# Patient Record
Sex: Female | Born: 1935 | Race: Black or African American | Hispanic: No
Health system: Southern US, Community
[De-identification: ages and names within clinical notes are randomized; demographics above are authoritative.]

---

## 2002-10-07 ENCOUNTER — Encounter: Payer: Self-pay | Admitting: Internal Medicine

## 2002-10-07 ENCOUNTER — Ambulatory Visit (HOSPITAL_COMMUNITY): Admission: RE | Admit: 2002-10-07 | Discharge: 2002-10-07 | Payer: Self-pay | Admitting: Internal Medicine

## 2003-10-15 ENCOUNTER — Ambulatory Visit (HOSPITAL_COMMUNITY): Admission: RE | Admit: 2003-10-15 | Discharge: 2003-10-15 | Payer: Self-pay | Admitting: Family Medicine

## 2004-04-06 ENCOUNTER — Ambulatory Visit (HOSPITAL_COMMUNITY): Admission: RE | Admit: 2004-04-06 | Discharge: 2004-04-06 | Payer: Self-pay | Admitting: Family Medicine

## 2004-06-22 ENCOUNTER — Ambulatory Visit (HOSPITAL_COMMUNITY): Admission: RE | Admit: 2004-06-22 | Discharge: 2004-06-22 | Payer: Self-pay | Admitting: Pediatrics

## 2004-07-26 ENCOUNTER — Emergency Department (HOSPITAL_COMMUNITY): Admission: EM | Admit: 2004-07-26 | Discharge: 2004-07-26 | Payer: Self-pay

## 2004-07-29 ENCOUNTER — Ambulatory Visit (HOSPITAL_COMMUNITY): Admission: RE | Admit: 2004-07-29 | Discharge: 2004-07-29 | Payer: Self-pay | Admitting: Family Medicine

## 2004-08-03 ENCOUNTER — Ambulatory Visit (HOSPITAL_COMMUNITY): Admission: RE | Admit: 2004-08-03 | Discharge: 2004-08-03 | Payer: Self-pay | Admitting: Family Medicine

## 2004-08-17 ENCOUNTER — Ambulatory Visit (HOSPITAL_COMMUNITY): Admission: RE | Admit: 2004-08-17 | Discharge: 2004-08-17 | Payer: Self-pay | Admitting: Family Medicine

## 2004-11-02 ENCOUNTER — Ambulatory Visit: Payer: Self-pay | Admitting: Family Medicine

## 2005-01-04 ENCOUNTER — Ambulatory Visit: Payer: Self-pay | Admitting: Family Medicine

## 2005-01-04 ENCOUNTER — Ambulatory Visit (HOSPITAL_COMMUNITY): Admission: RE | Admit: 2005-01-04 | Discharge: 2005-01-04 | Payer: Self-pay | Admitting: Family Medicine

## 2005-02-15 ENCOUNTER — Ambulatory Visit: Payer: Self-pay | Admitting: Family Medicine

## 2005-03-30 ENCOUNTER — Ambulatory Visit: Payer: Self-pay | Admitting: Family Medicine

## 2005-04-15 ENCOUNTER — Ambulatory Visit: Payer: Self-pay | Admitting: Family Medicine

## 2005-05-24 ENCOUNTER — Ambulatory Visit: Payer: Self-pay | Admitting: Family Medicine

## 2005-07-18 ENCOUNTER — Ambulatory Visit (HOSPITAL_COMMUNITY): Admission: RE | Admit: 2005-07-18 | Discharge: 2005-07-18 | Payer: Self-pay | Admitting: Family Medicine

## 2005-07-27 ENCOUNTER — Emergency Department (HOSPITAL_COMMUNITY): Admission: EM | Admit: 2005-07-27 | Discharge: 2005-07-27 | Payer: Self-pay | Admitting: Emergency Medicine

## 2005-08-09 ENCOUNTER — Ambulatory Visit: Payer: Self-pay | Admitting: Family Medicine

## 2005-10-20 ENCOUNTER — Emergency Department (HOSPITAL_COMMUNITY): Admission: EM | Admit: 2005-10-20 | Discharge: 2005-10-20 | Payer: Self-pay | Admitting: Emergency Medicine

## 2005-11-04 ENCOUNTER — Ambulatory Visit: Payer: Self-pay | Admitting: Family Medicine

## 2006-03-02 ENCOUNTER — Ambulatory Visit: Payer: Self-pay | Admitting: Family Medicine

## 2006-07-18 ENCOUNTER — Ambulatory Visit: Payer: Self-pay | Admitting: Family Medicine

## 2006-10-13 ENCOUNTER — Emergency Department (HOSPITAL_COMMUNITY): Admission: EM | Admit: 2006-10-13 | Discharge: 2006-10-13 | Payer: Self-pay | Admitting: Emergency Medicine

## 2006-10-30 ENCOUNTER — Ambulatory Visit (HOSPITAL_COMMUNITY): Admission: RE | Admit: 2006-10-30 | Discharge: 2006-10-30 | Payer: Self-pay | Admitting: Family Medicine

## 2006-10-30 ENCOUNTER — Ambulatory Visit: Payer: Self-pay | Admitting: Family Medicine

## 2006-11-14 ENCOUNTER — Ambulatory Visit: Payer: Self-pay | Admitting: Family Medicine

## 2006-12-01 ENCOUNTER — Ambulatory Visit: Payer: Self-pay | Admitting: Family Medicine

## 2006-12-14 ENCOUNTER — Ambulatory Visit: Payer: Self-pay | Admitting: Gastroenterology

## 2006-12-26 ENCOUNTER — Ambulatory Visit: Payer: Self-pay | Admitting: Family Medicine

## 2007-01-11 ENCOUNTER — Ambulatory Visit (HOSPITAL_COMMUNITY): Admission: RE | Admit: 2007-01-11 | Discharge: 2007-01-11 | Payer: Self-pay | Admitting: Gastroenterology

## 2007-01-11 ENCOUNTER — Ambulatory Visit: Payer: Self-pay | Admitting: Gastroenterology

## 2007-01-11 ENCOUNTER — Encounter (INDEPENDENT_AMBULATORY_CARE_PROVIDER_SITE_OTHER): Payer: Self-pay | Admitting: Specialist

## 2007-02-02 ENCOUNTER — Ambulatory Visit: Payer: Self-pay | Admitting: Family Medicine

## 2007-03-15 ENCOUNTER — Ambulatory Visit: Payer: Self-pay | Admitting: Family Medicine

## 2007-03-16 ENCOUNTER — Encounter: Payer: Self-pay | Admitting: Family Medicine

## 2007-03-16 LAB — CONVERTED CEMR LAB
ALT: 15 units/L (ref 0–35)
AST: 19 units/L (ref 0–37)
Alkaline Phosphatase: 64 units/L (ref 39–117)
BUN: 40 mg/dL — ABNORMAL HIGH (ref 6–23)
Basophils Relative: 0 % (ref 0–1)
Calcium: 9.1 mg/dL (ref 8.4–10.5)
Cholesterol: 143 mg/dL (ref 0–200)
Creatinine, Ser: 1.82 mg/dL — ABNORMAL HIGH (ref 0.40–1.20)
Eosinophils Absolute: 0.1 10*3/uL (ref 0.0–0.7)
Indirect Bilirubin: 0.2 mg/dL (ref 0.0–0.9)
MCHC: 30.8 g/dL (ref 30.0–36.0)
MCV: 92.6 fL (ref 78.0–100.0)
Monocytes Relative: 5 % (ref 3–11)
Neutrophils Relative %: 75 % (ref 43–77)
Potassium: 4.4 meq/L (ref 3.5–5.3)
RBC: 3.37 M/uL — ABNORMAL LOW (ref 3.87–5.11)
Total Protein: 6.8 g/dL (ref 6.0–8.3)
Triglycerides: 80 mg/dL (ref <150)
VLDL: 16 mg/dL (ref 0–40)

## 2007-03-17 ENCOUNTER — Ambulatory Visit: Payer: Self-pay | Admitting: Gastroenterology

## 2007-03-17 ENCOUNTER — Inpatient Hospital Stay (HOSPITAL_COMMUNITY): Admission: EM | Admit: 2007-03-17 | Discharge: 2007-03-19 | Payer: Self-pay | Admitting: Emergency Medicine

## 2007-03-30 ENCOUNTER — Ambulatory Visit (HOSPITAL_COMMUNITY): Admission: RE | Admit: 2007-03-30 | Discharge: 2007-03-30 | Payer: Self-pay | Admitting: Family Medicine

## 2007-04-04 ENCOUNTER — Ambulatory Visit (HOSPITAL_COMMUNITY): Admission: RE | Admit: 2007-04-04 | Discharge: 2007-04-04 | Payer: Self-pay | Admitting: Family Medicine

## 2007-05-09 ENCOUNTER — Ambulatory Visit: Payer: Self-pay | Admitting: Family Medicine

## 2007-06-16 ENCOUNTER — Emergency Department (HOSPITAL_COMMUNITY): Admission: EM | Admit: 2007-06-16 | Discharge: 2007-06-16 | Payer: Self-pay | Admitting: Emergency Medicine

## 2007-06-18 ENCOUNTER — Ambulatory Visit: Payer: Self-pay | Admitting: Orthopedic Surgery

## 2007-06-19 ENCOUNTER — Ambulatory Visit: Payer: Self-pay | Admitting: Family Medicine

## 2007-07-02 ENCOUNTER — Ambulatory Visit: Payer: Self-pay | Admitting: Family Medicine

## 2007-07-18 ENCOUNTER — Ambulatory Visit: Payer: Self-pay | Admitting: Orthopedic Surgery

## 2007-08-16 ENCOUNTER — Ambulatory Visit: Payer: Self-pay | Admitting: Orthopedic Surgery

## 2007-08-22 ENCOUNTER — Ambulatory Visit: Payer: Self-pay | Admitting: Family Medicine

## 2007-08-27 ENCOUNTER — Emergency Department (HOSPITAL_COMMUNITY): Admission: EM | Admit: 2007-08-27 | Discharge: 2007-08-28 | Payer: Self-pay | Admitting: *Deleted

## 2007-09-22 ENCOUNTER — Inpatient Hospital Stay (HOSPITAL_COMMUNITY): Admission: EM | Admit: 2007-09-22 | Discharge: 2007-09-26 | Payer: Self-pay | Admitting: Emergency Medicine

## 2007-09-25 ENCOUNTER — Ambulatory Visit: Payer: Self-pay | Admitting: Family Medicine

## 2007-10-27 IMAGING — CR DG HAND COMPLETE 3+V*R*
2 series · 2 of 2 positions shown · non-contrast
Comparison: none

CLINICAL DATA: Fall. Hand trauma and pain.
 RIGHT HAND - 3 VIEW:

[view not recorded (1 of 2)]
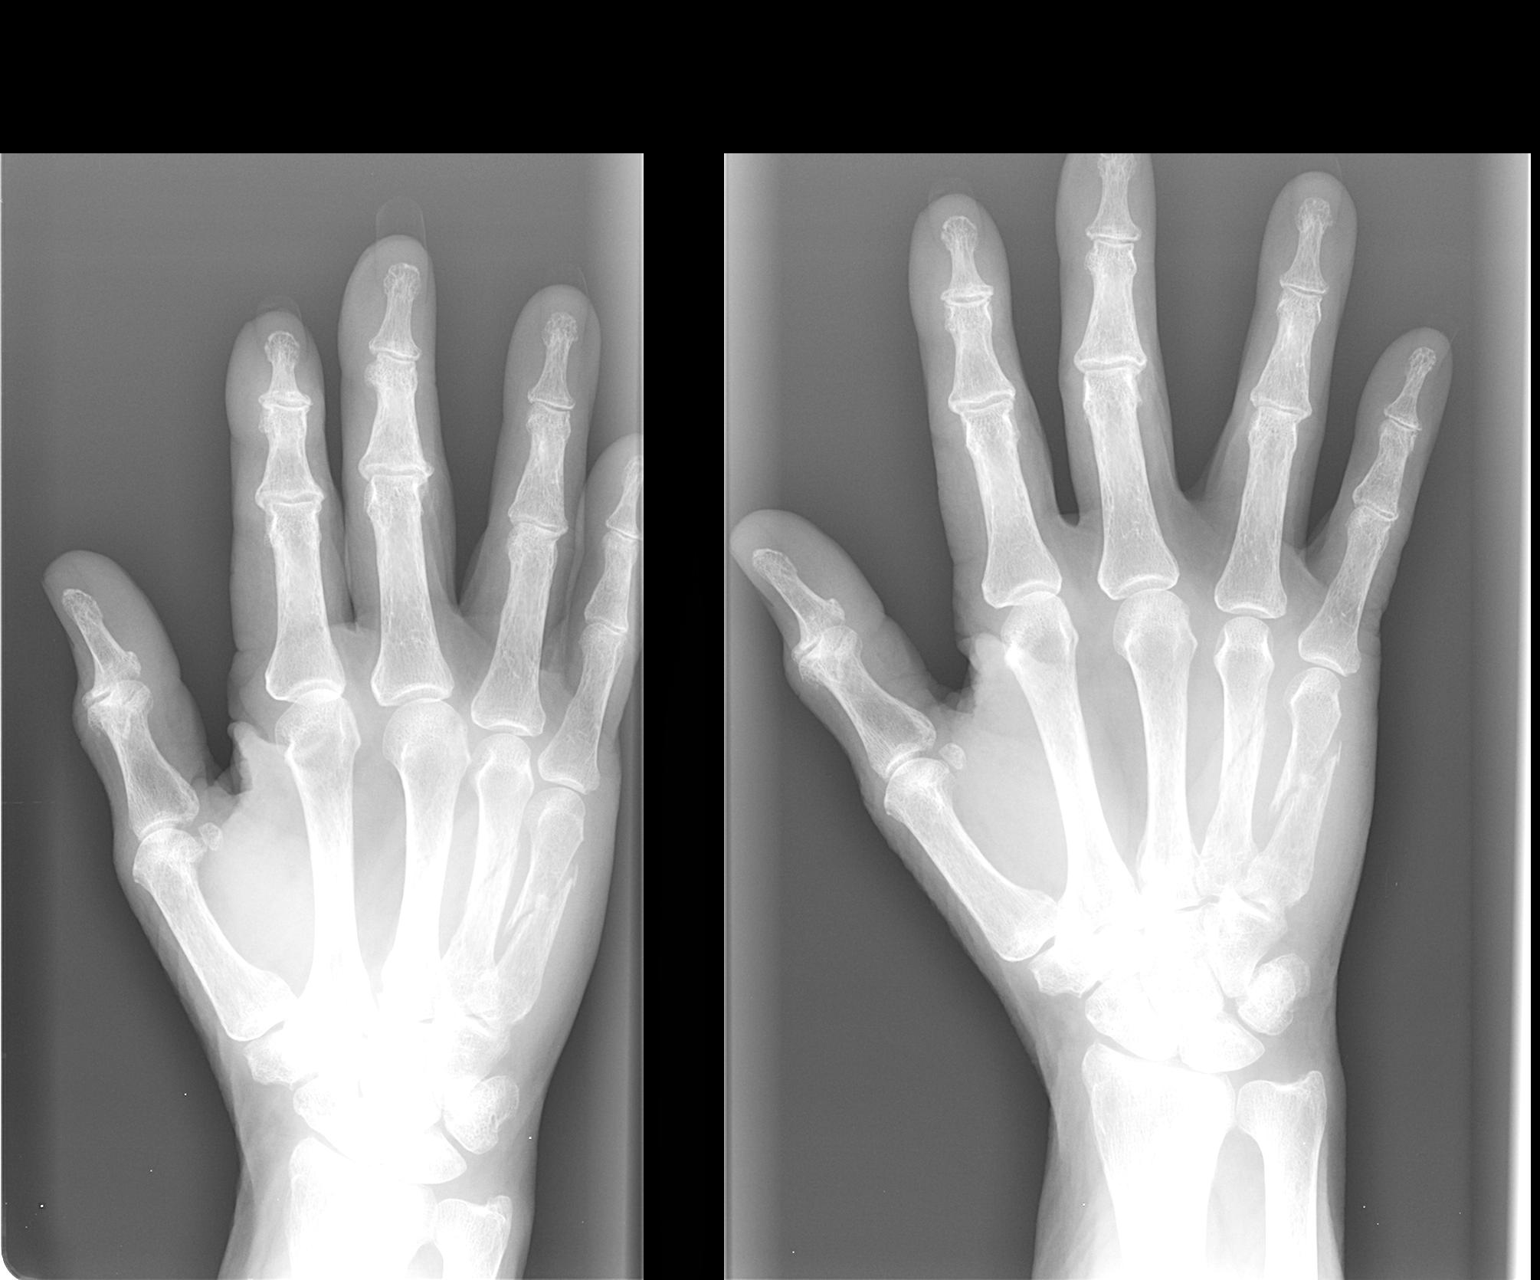

[view not recorded (2 of 2)]
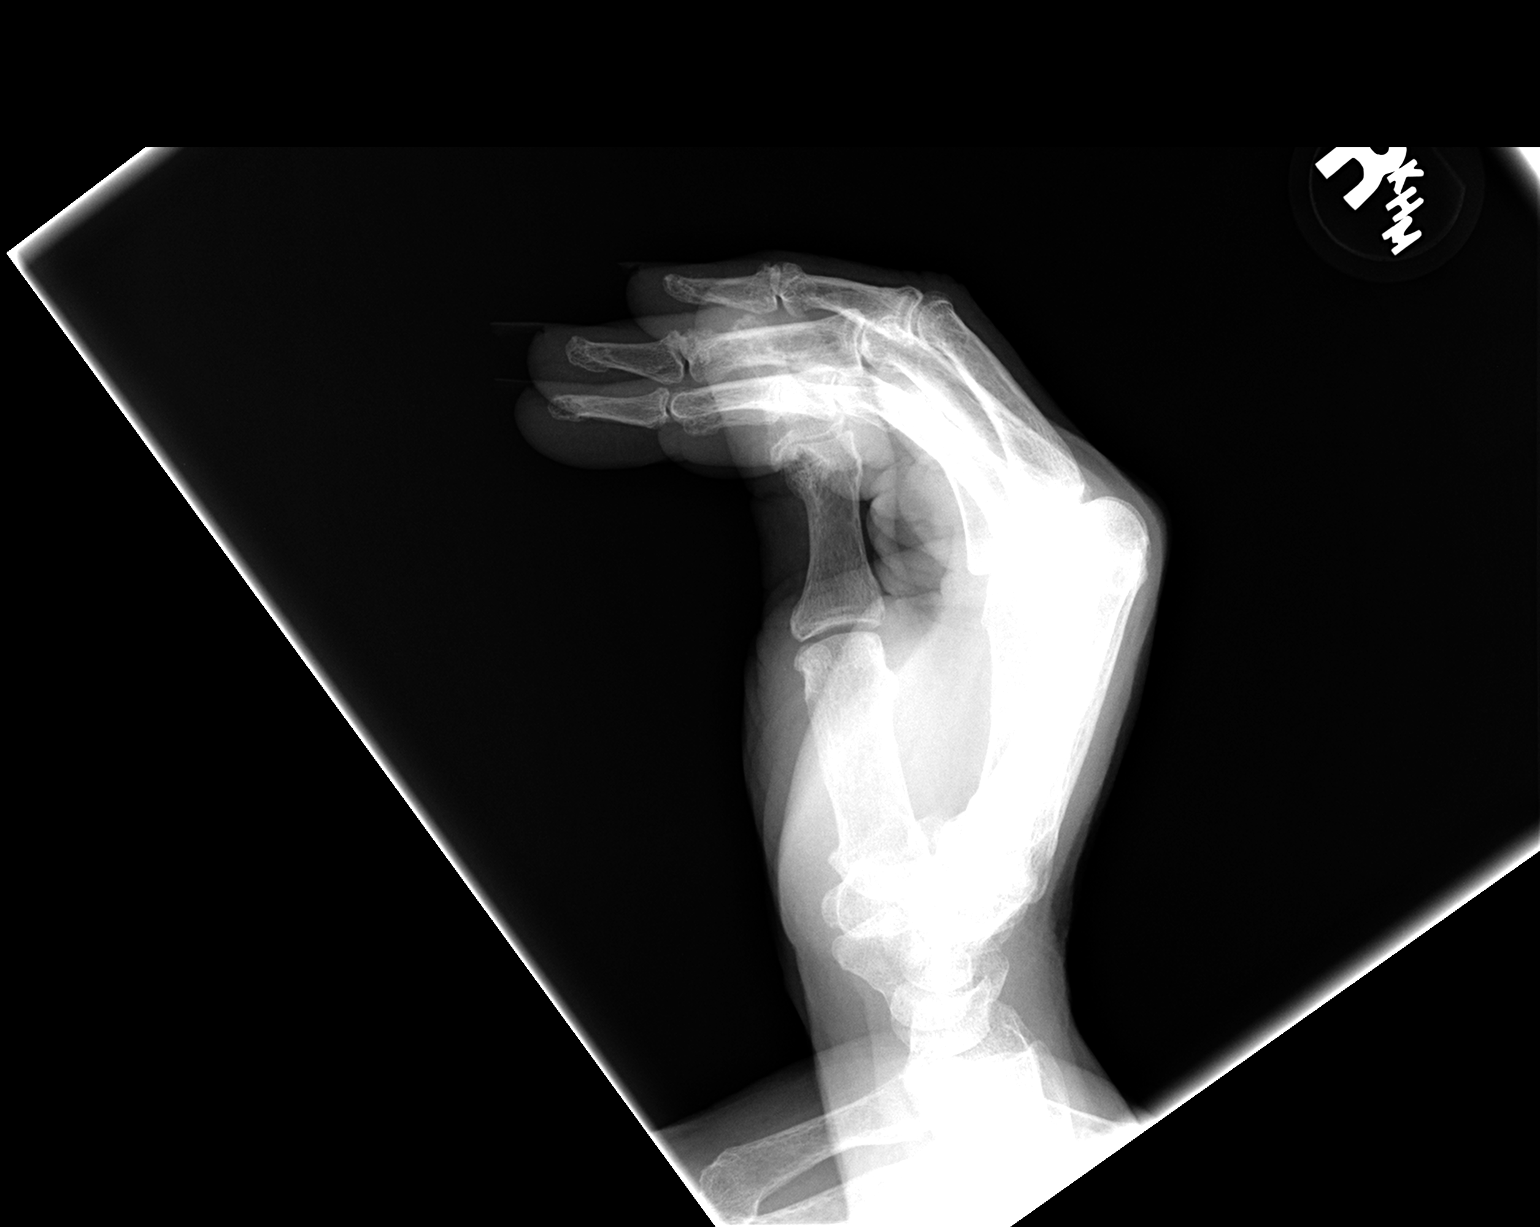

[2 of 2 positions shown; findings below may reference images not displayed]

FINDINGS: Oblique fractures are seen involving the fourth and fifth metacarpal shafts. There is mild impaction of the fifth metacarpal fracture.
 No other definite fractures are seen. Alignment of the bones is normal. Mild osteoarthritis is seen involving the interphalangeal joints of all digits.
IMPRESSION: Oblique fractures of the fourth and fifth metacarpal shafts.

## 2007-12-20 ENCOUNTER — Encounter: Payer: Self-pay | Admitting: Family Medicine

## 2008-02-02 IMAGING — CT CT HEAD W/O CM
1 series · 15 of 30 positions shown, 19 images · IV contrast (agent unspecified)
Comparison: 10/20/2005

CLINICAL DATA: Altered mental status change. 
HEAD CT WITHOUT CONTRAST:
TECHNIQUE: Contiguous axial images were obtained from the base of the skull through the vertex according to standard protocol without contrast.

[Series 2: headseq 4.8 h37s · axial · 0.43mm/px · z∈[+62,+190]mm · 15 of 30 slices shown, 19 images]
[im 2/30  brain]
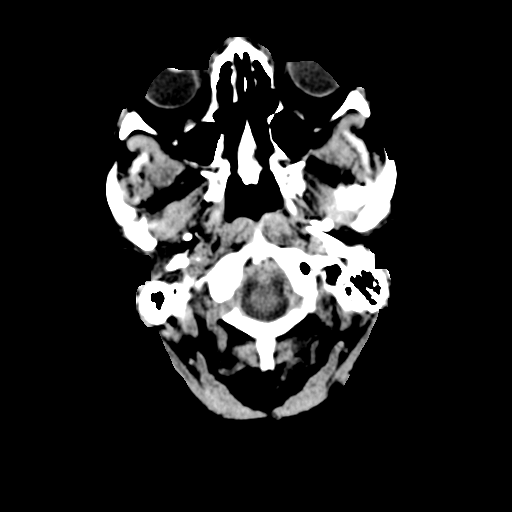
[im 2/30  bone]
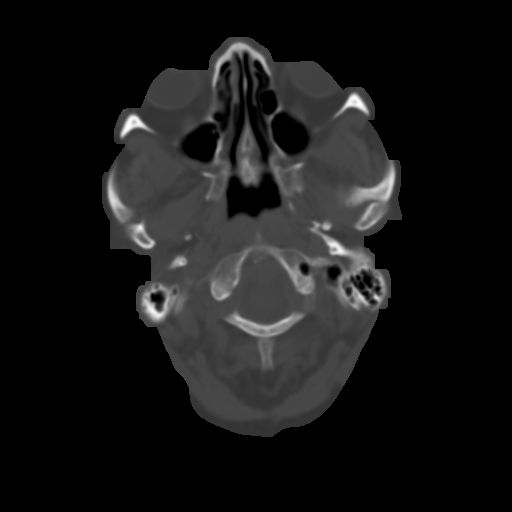
[im 4/30  brain]
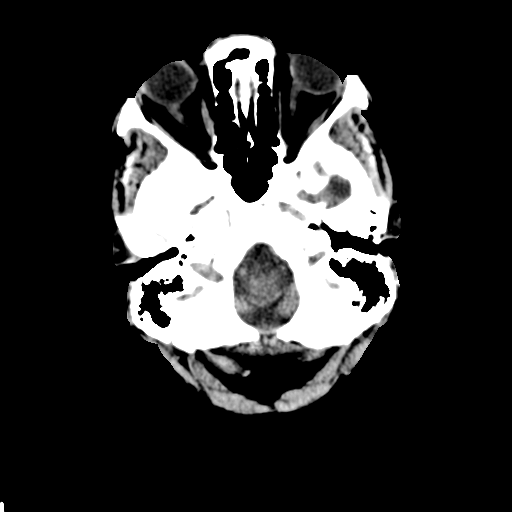
[im 6/30  brain]
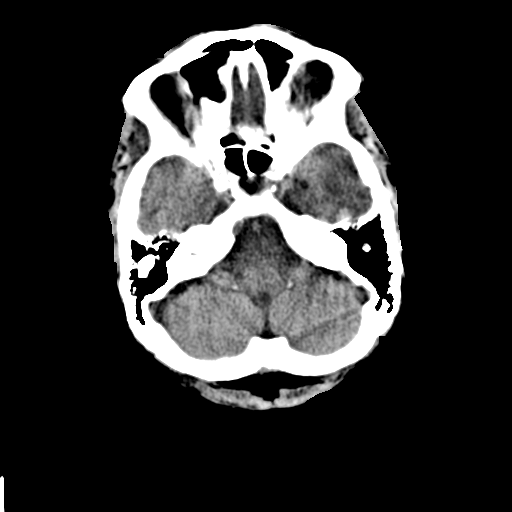
[im 8/30  brain]
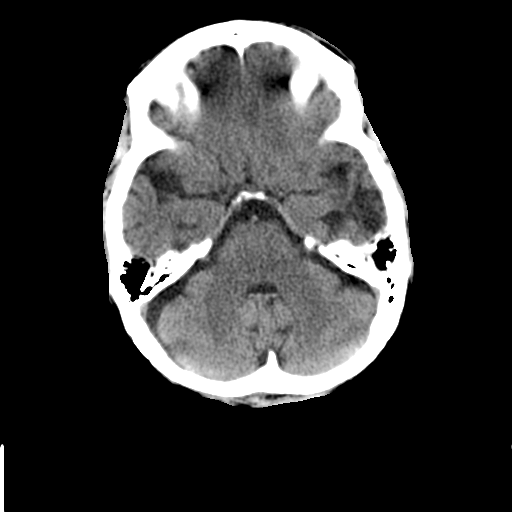
[im 10/30  brain]
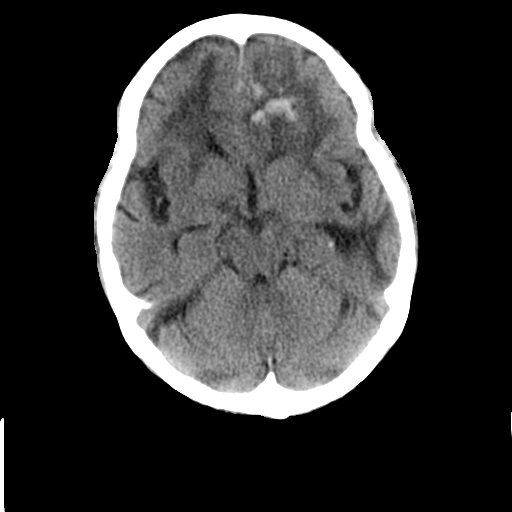
[im 10/30  bone]
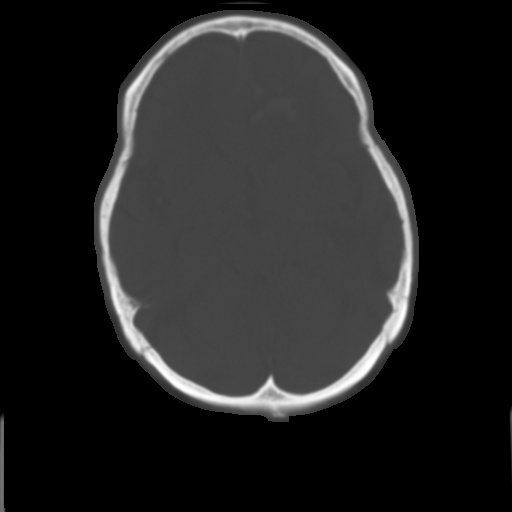
[im 12/30  brain]
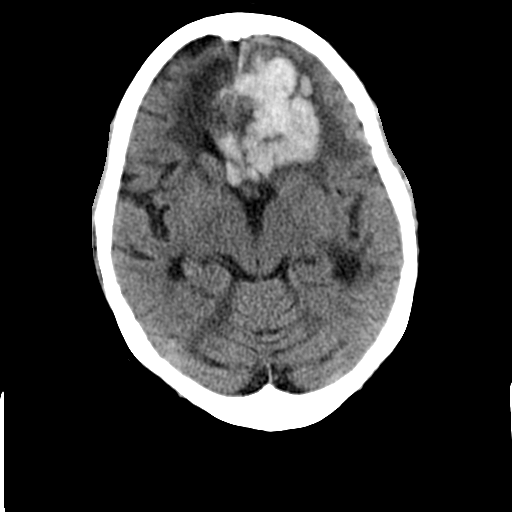
[im 14/30  brain]
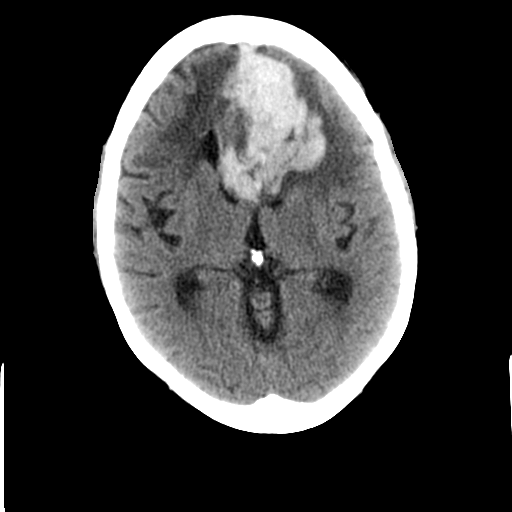
[im 16/30  brain]
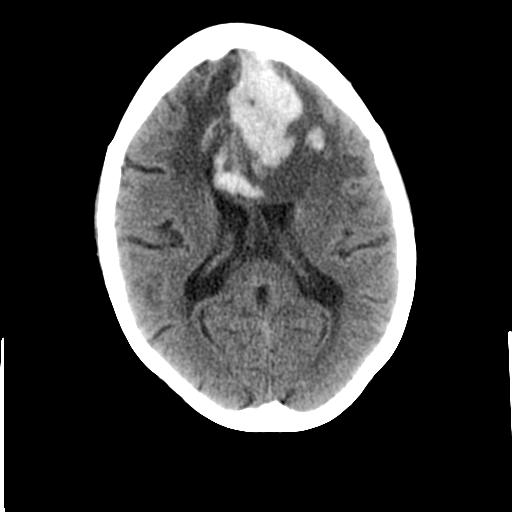
[im 17/30  brain]
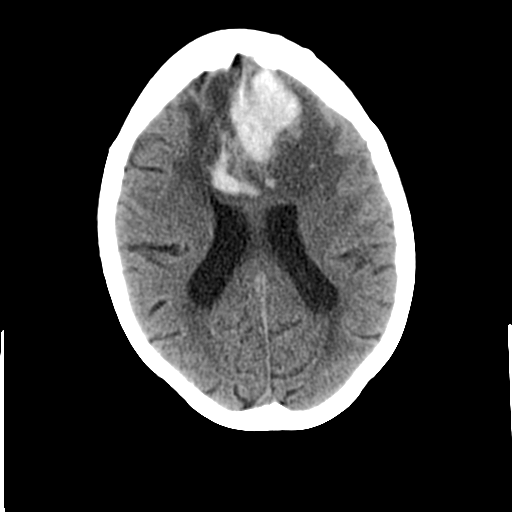
[im 17/30  bone]
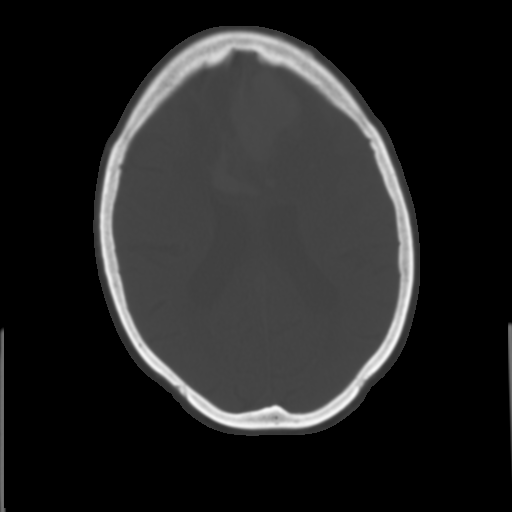
[im 19/30  brain]
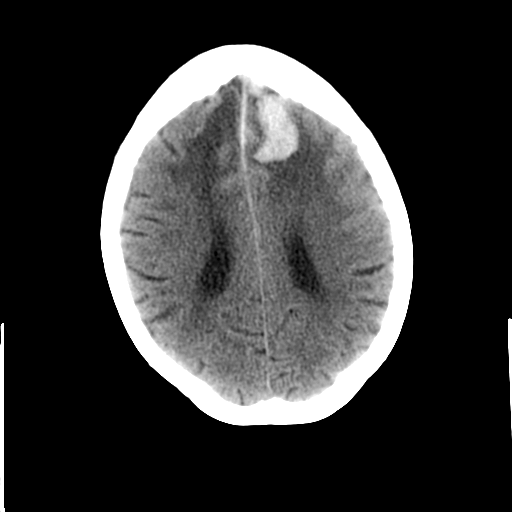
[im 21/30  brain]
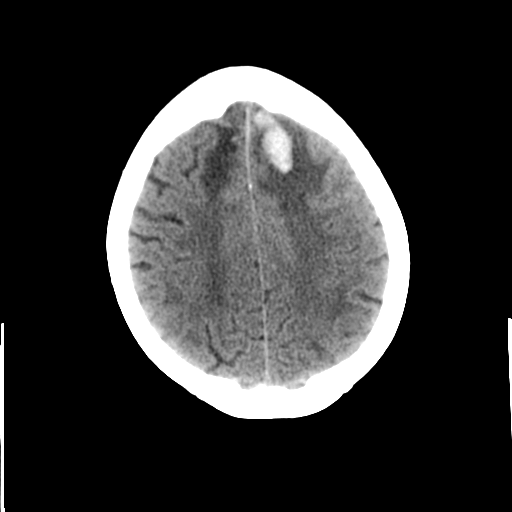
[im 23/30  brain]
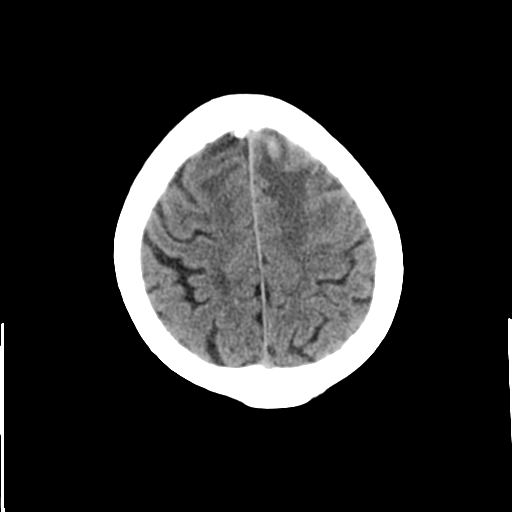
[im 25/30  brain]
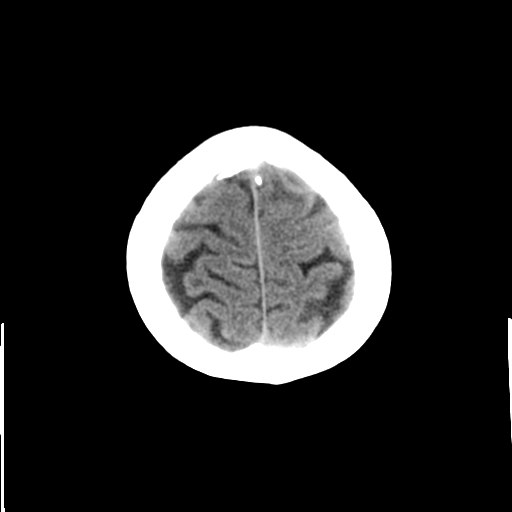
[im 25/30  bone]
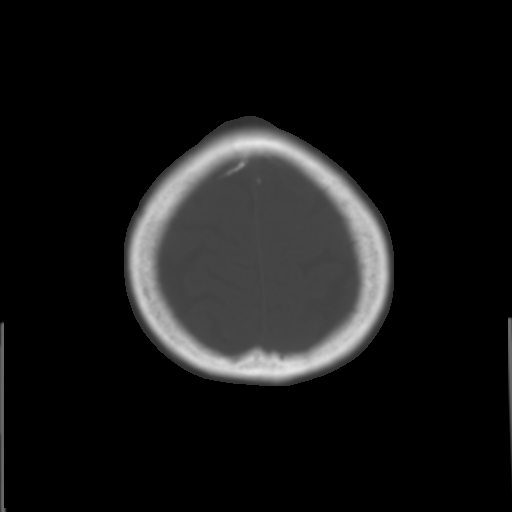
[im 27/30  brain]
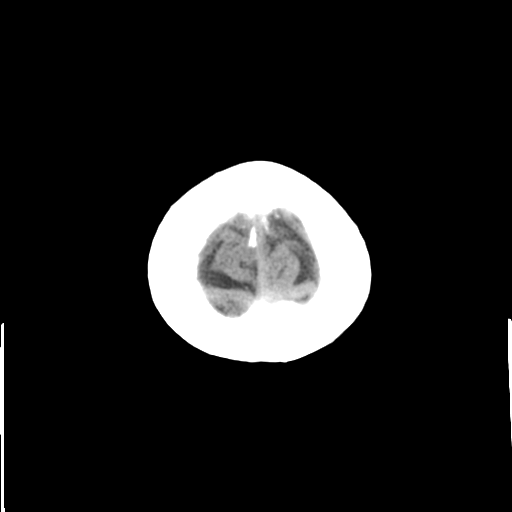
[im 29/30  brain]
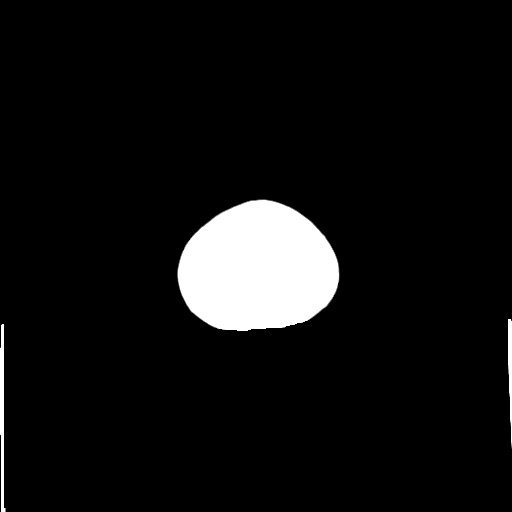

[15 of 30 positions shown; findings below may reference images not displayed]

FINDINGS: There is a large intraparenchymal hemorrhage centered within the left anterior frontal lobe measuring 5.5 cm x 3.5 cm in greatest axial dimension. This intraparenchymal hemorrhage has mass effect compressing the left frontal horn of the ventricle as well as bowing the anterior interhemispheric fissure.  The hemorrhage does cross midline across the anterior corpus callosum and extends along the right paramedian frontal lobe. The third ventricle is similar in size to comparison exam. The basilar cisterns are patent. There is a rim of hypoattenuation surrounding the hemorrhage consistent with edema.  Hypoattenuation in the right frontal lobe consistent with prior infarction. Paranasal sinuses and mastoid air cells are clear.  Orbits appear normal.
IMPRESSION: 1.  Large (6cm) intraparenchymal hemorrhage within the left frontal lobe.  
  2.  Mass effect with bowing of the anterior interhemispheric fissure and effacement of the left frontal ventricular horn. 
3.  Extention across the corpus callosum into the right frontal lobe. 
4.  Evidence of prior right frontal infarct. 
Findings conveyed to Dr. Wuashinton Loba at approximately [DATE] p.m. on 09/22/2007.

## 2011-01-09 ENCOUNTER — Encounter: Payer: Self-pay | Admitting: Family Medicine

## 2011-01-18 NOTE — Letter (Signed)
Summary: RPC chart  RPC chart   Imported By: Curtis Sites 07/09/2010 10:18:36  _____________________________________________________________________  External Attachment:    Type:   Image     Comment:   External Document

## 2011-05-03 NOTE — Consult Note (Signed)
[name], [name]                ACCOUNT NO.:  [id number]   MEDICAL RECORD NO.:  [id number]          PATIENT TYPE:  INP   LOCATION:  A334                          FACILITY:  [facility]   PHYSICIAN:  Kofi A. Gerilyn Pilgrim, M.D. DATE OF BIRTH:  27-Jul-1922   DATE OF CONSULTATION:  DATE OF DISCHARGE:                                 CONSULTATION   REASON FOR CONSULTATION:  Altered mental status.   This is an 77 year old, black female who has a baseline history of  dementia. The patient was found unresponsive for an unknown duration.  She was taken to the hospital where she was noted to have a large left  frontal cerebral hematoma. She was admitted to the ICU but the patient  has been placed on a DNR order per the family.   PAST MEDICAL HISTORY:  Significant for hypertension, anemia, dementia,  hyperlipidemia, and insulin dependent diabetes.   FAMILY HISTORY:  Significant for hypertensive coronary artery disease.   SOCIAL HISTORY:  No tobacco, alcohol or illicit drug use. She is  widowed.   PAST SURGICAL HISTORY:  Left knee surgery.   ALLERGIES:  BEE STINGS.   PHYSICAL EXAMINATION:  Afebrile, blood pressure 149/69, respirations 18.  HEENT:  Neck was supple. Head is normocephalic, atraumatic.  ABDOMEN:  Soft.  EXTREMITIES:  No significant edema.  MENTATION:  She lies in bed with eyes closed. She does open her eyes to  sternal rub. No verbal output is noted. She does not follow commands,  tracks or focuses.  CRANIAL NERVES:  Pupils are 4 mm and reactive. Coronary reflexes are  intact. Oculocephalic reflexes are intact. Facial muscle strength  appears to be symmetric.  MOTOR:  Shows a right hemiplegia. She moves the left side spontaneously  and purposely and in fact she localizes fairly well. __________ show no  dysmetria is noted. On the left side, she does have tremor involving the  left upper extremity. Reflexes are +2 and preserved bilaterally.   Head CT scan of the brain shows a large  left frontal hematoma which  crosses to the contralateral side via the anterior corpus callosum.  There is a mass effect, no intraventricular blood is noted. There is  some effacement of the left frontal horn.   ASSESSMENT:  1. Altered mental status due to large left frontal cerebral hematoma,      prognosis poor.  2. Baseline Alzheimer's dementia.  3. Left upper extremity tremor.   RECOMMENDATIONS:  I will not treat a tremor at this time. Given the poor  prognosis and underlying baseline dementia, I think DNR is appropriate  and in fact comfort measures are suggested. Continue with serial  neurological evaluation/examination.   Thank you for this consultation.      Kofi A. Gerilyn Pilgrim, M.D.  Electronically Signed     KAD/MEDQ  D:  09/25/2007  T:  09/25/2007  Job:  [id number]

## 2011-05-03 NOTE — Group Therapy Note (Signed)
[name], [name]                ACCOUNT NO.:  [id number]   MEDICAL RECORD NO.:  [id number]          PATIENT TYPE:  INP   LOCATION:  A334                          FACILITY:  [facility]   PHYSICIAN:  Skeet Latch, DO    DATE OF BIRTH:  April 17, 1922   DATE OF PROCEDURE:  09/25/2007  DATE OF DISCHARGE:                                 PROGRESS NOTE   SUBJECTIVE:  [name] is an 77 year old African-American female who  is in from a nursing home where she was found unresponsive in a  wheelchair for unknown duration.  The patient is brought to the  emergency room for evaluation. Upon having a CT of her head performed it  showed a large left frontal parenchymal hemorrhage 6 x 3 cm.  It also  showed mass effect with mild left ventricular interhemispheric fissure  with some extension to the right frontal lobe.  The patient was sent to  the intensive care unit, and neurology was consulted.  The patient has  been unresponsive except for some tremors at times and some movements of  her eyes and hands, but has not responded to stimuli or any other  tactile stimulus.  The patient has subsequently been admitted DNR, and  hospice has seen patient and tomorrow will start discharge planning.   OBJECTIVE/PHYSICAL EXAMINATION:  VITAL SIGNS:  Temperature 102.3, pulse  73, respirations 24, blood pressure 152/60.  HEENT:  The pupils are pretty fixed.  She has __________ reflexes to her  pupils with light stimulus.  At times she did open her eyes to deep  painful stimulus.  CARDIOVASCULAR:  Regular with ejection murmur.  RESPIRATORY:  Lungs are clear.  No rales, rhonchi or wheezing.  ABDOMEN:  Soft, nondistended.  EXTREMITIES:  No clubbing or cyanosis.   LABORATORY DATA:  Labs today show sodium 151, potassium 3.9, chloride  115, CO2 24, glucose 166, BUN 16, creatinine 1.09.  White count 12.7,  hemoglobin 8.4, hematocrit 25.5, platelet count 307.   ASSESSMENT/PLAN:  Large right intracerebral hemorrhage.   The patient has  very poor prognosis, and hospice will take over the patient's care on  September 26, 2007.  Will continue supportive measures with pain control at  this time.  Neurology continues to consult daily.      Skeet Latch, DO  Electronically Signed     SM/MEDQ  D:  09/25/2007  T:  09/26/2007  Job:  [id number]

## 2011-05-03 NOTE — Discharge Summary (Signed)
[name], [name]                ACCOUNT NO.:  [id number]   MEDICAL RECORD NO.:  [id number]          PATIENT TYPE:  INP   LOCATION:  A334                          FACILITY:  [facility]   PHYSICIAN:  Osvaldo Shipper, MD     DATE OF BIRTH:  02-Oct-1922   DATE OF ADMISSION:  09/22/2007  DATE OF DISCHARGE:  10/08/2008LH                               DISCHARGE SUMMARY   PRIMARY CARE PHYSICIAN:  Jory Ee, M.D.   DISCHARGE DIAGNOSES:  1. Large left-sided frontal parenchymal hemorrhage with mass effect      with extremely poor prognosis.  2. Patient being discharged to [facility].  3. History of Alzheimer's disease.  4. History of hypertension.  5. Diabetes.  6. Chronic renal insufficiency.   Please review H&P dictated by Dr. Lewis Moccasin for details regarding  patient's presenting illness.   BRIEF HOSPITAL COURSE:  Briefly, this is an 77 year old African American  female who presented from the nursing home after she was found  unresponsive in a wheelchair.  Patient was found to have a large frontal  brain hemorrhage.  Patient was admitted initially to the ICU but her  prognosis was thought to be extremely poor.  Patient's family decided to  make her DNR based on her presenting diagnosis as well as her prognosis.   Patient was subsequently transferred to the Hospice room here in the  hospital.  [facility] here.  Today,  apparently they have a bed available for her.   Patient today is lying on the bed, turned to the left side.  She has her  eyes open but unable to really follow me around, although I was told  that she has been doing that in the past day.  Temperature was 102.3  yesterday.  Temperature last evening 101.  Temperature this morning is  afebrile.  Blood pressure is a little bit elevated.  Other vital signs  are stable.  I really did not perform a full examination today  considering her prognosis and her current clinical situation.   The plan  for her is to go to the Hospice Home today.  Her long-term  prognosis is extremely poor.  According to her family, she seems to be a  little bit more alert, though the whole thing is very questionable.  It  is possible that her brain may regain some function but again, her long-  term prognosis is not very good.  Her sodium yesterday was 151.  We have  not checked it again because it really does not change management.   DISCHARGE MEDICATIONS:  1. Morphine 2 mg every 2-3 hours p.r.n. pain control and anxiety.  2. She may continue IV fluids 80 mL per hour of half normal saline if      needed.  3. Ativan may also be utilized per Hospice for sedation purposes.   Her CBGs were being checked every six hours.  Again, would defer to the  Hospice Home to decide this.  She is really just on a sliding scale and  her blood sugars have been running pretty  optimal at this time.  I would  recommend CBG b.i.d. and then defer to the physician in charge at  [facility] to decide what to do further.   Patient obviously is a DNR/DNI at this time.   She is bed-ridden.   Her life expectancy at this time, I am not able to exactly say how long  she has to go, but definitely is probably not more than three to six  months.   DURATION OF DISCHARGE ENCOUNTER:  35 minutes.      Osvaldo Shipper, MD  Electronically Signed     GK/MEDQ  D:  09/26/2007  T:  09/26/2007  Job:  [id number]   cc:   Jory Ee  Fax: [phone]   Kofi A. Gerilyn Pilgrim, M.D.  Fax: [phone]

## 2011-05-03 NOTE — H&P (Signed)
[name], [name]                ACCOUNT NO.:  [id number]   MEDICAL RECORD NO.:  [id number]          PATIENT TYPE:  INP   LOCATION:  IC06                          FACILITY:  [facility]   PHYSICIAN:  Skeet Latch, DO    DATE OF BIRTH:  11-02-1922   DATE OF ADMISSION:  09/22/2007  DATE OF DISCHARGE:  LH                              HISTORY & PHYSICAL   PRIMARY CARE PHYSICIAN:  Dr. Alan Mulder.   CHIEF COMPLAINT:  Altered mental status.   HISTORY OF PRESENT ILLNESS:  [name] is an 77 year old African-  American female who presents from nursing home when she was found to be  unresponsive in a wheelchair for unknown duration. The patient is  brought via EMS from the nursing home to the emergency room. The patient  is unresponsive, not communicative. Is having tremors at times but does  not respond to any painful stimulus. Upon examination, in the emergency  room, the patient did have a CT of her head performed, which showed  large left frontal parenchymal hemorrhage, 6 x 3 cm. It showed a mass  effect with mild left ventricular and intra-hemispheric fissure with  some extension into the right frontal lobe. The patient's status is  unchanged and she has been brought up to the Intensive Care Unit.   PAST MEDICAL HISTORY:  Includes chronic anemia, Alzheimer's disease,  hypertension, diabetes, chronic renal insufficiency, hyperlipidemia, and  congestive heart failure.   MEDICATIONS:  Include Temazepam 15 mg p.o. at bedtime, aspirin 81 mg at  bedtime, Vytorin 10/10 p.o. daily, Avalide 300/25 mg daily. Actos 5 mg  daily, ferrous sulfate 325 mg daily, Dulcolax 5 mg p.o. as needed,  Tylenol 1 tab p.o. at bedtime, Aricept 10 mg daily, Glipizide XL 2.5 mg  daily, and Prilosec 20 mg daily.   ALLERGIES:  BEE STINGS.   SOCIAL HISTORY:  Left knee surgery in 1953.   FAMILY HISTORY:  History of bone cancer in father.   SOCIAL HISTORY:  The patient is a widow.   REVIEW OF SYSTEMS:   Unobtainable.   PHYSICAL EXAMINATION:  VITAL SIGNS:  Blood pressure 139/43, pulse 67,  respiratory rate 22. The patient is sating 100% on 50% mini mask.   LABORATORY DATA:  Sodium 142, potassium 3.9, chloride 106, CO2 27,  glucose 181. [name] 21, creatinine 1.35. PT is 13.8, INR 1.0. White count  is 12.5. Hemoglobin 9.4, hematocrit 28.0. Platelet count is 411,000.   CT of the head, please see HPI.   IMPRESSION/PLAN:  1. Intracerebral hemorrhage per CT scan of her head. The patient has      been made a DNR at this time. The patient is not showing any signs      of responsiveness at this time. The patient is in the Intensive      Care Unit and is still being monitored but with very poor prognosis      at this time.  2. Type 2 diabetes. The patient will be placed on sliding scale and      blood sugars checked at least 3 times a day.  3. Renal insufficiency. The patient will be slowly hydrated and have      her [name] and creatinine checked closely.   Neurology will be consulted at this time. The neurologist is not  available until the next 48 hours. All of the patient's medications are  p.o., which will obviously be stopped at this time. The patient will be  placed on Tylenol rectally for any fever and will get neurologic checks  regularly. The patient will be placed on GI prophylaxis and the patient  will be placed on Ativan for any seizure-like activity.   Very poor prognosis at this time. Family members aware of condition of  the patient at this time and agree with supportive measures at this  time.      Skeet Latch, DO  Electronically Signed     SM/MEDQ  D:  09/22/2007  T:  09/22/2007  Job:  [id number]

## 2011-05-06 NOTE — Consult Note (Signed)
NAMELUNETTA, [name]                ACCOUNT NO.:  [id number]   MEDICAL RECORD NO.:  [id number]           PATIENT TYPE:   LOCATION:                                 FACILITY:   PHYSICIAN:  Kassie Mends, M.D.      DATE OF BIRTH:  09-09-1922   DATE OF CONSULTATION:  12/14/2006  DATE OF DISCHARGE:                                 CONSULTATION   REASON FOR CONSULTATION:  Heme-positive stool and anemia.   HISTORY OF PRESENT ILLNESS:  [name] is an 77 year old female with a  significant past medical history of labs drawn in [facility] 2007  which revealed a hemoglobin of 9.1 with an MCV of 94.4, platelet count  of 457, a BUN of 30 and a creatinine of 1.5 (0.40-1.20) with a retic  count of 1.1%, ferritin at 86, B12 of 949 and a folate of greater than  20.  [name] denies any bright red blood per rectum, black stool, weight  loss, or difficulty swallowing.  [name] denies nausea, vomiting,  constipation or diarrhea.  [name] has never had a colonoscopy.  [name] states  her appetite is too good.  [name] has occasional abdominal pain.   PAST MEDICAL HISTORY:  1. Diabetes  2. Frontal lobe tumor.  3. Hyperlipidemia.  4. Mild dementia.  5. Degenerative joint disease.   PAST SURGICAL HISTORY:  Hysterectomy in 1963.   ALLERGIES:  BEES.   MEDICATIONS:  1. Temazepam 15 mg q.h.s.  2. Aspirin 81 mg at q.h.s.  3. Tylenol at bedtime.  4. Avalide 300/25 daily.  5. Aricept 10 mg daily.  6. Glipizide 2.5 mg q.a.m.  7. Actos 45 mg p.o. q.a.m.  8. Amlodipine 5 mg p.o. daily.  9. Vytorin 10/10 p.o. q.h.s.  10.Omeprazole 20 mg p.o. daily.  11.Ferrous sulfate 325 mg p.o. daily.  12.Darvocet 1 tablet as needed q.4 h. for pain.   FAMILY HISTORY:  [name] has no family history of colon cancer or colon  polyps.   SOCIAL HISTORY:  [name] is widowed and her niece accompanies her to the  visit today.  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  [name] denies any tobacco  or alcohol use.   REVIEW OF SYSTEMS:  Her review of systems as  per the HPI; otherwise all  systems are negative.   PHYSICAL EXAM:  VITAL SIGNS:  Weight 136 pounds, height 4 feet 11  inches, BMI 27.5 (slightly overweight).  Temperature 98.5, blood  pressure 110/60, pulse 74.  GENERAL:  [name] is in no apparent distress, alert and oriented x4.  HEENT:  Exam is atraumatic, normocephalic.  Pupils equal, react to light.  Mouth  no oral lesions.  Posterior pharynx without erythema or exudate.  NECK:  Full range of motion.  No lymphadenopathy.  LUNGS:  Clear to  auscultation bilaterally.  CARDIOVASCULAR EXAM:  A regular rhythm, no  murmur, normal S1-S2.  ABDOMEN:  Bowel sounds are present, soft,  nontender, nondistended.  No rebound or guarding, no hepatosplenomegaly,  no bruits, no pulsatile masses.  Slightly obese.  EXTREMITIES:  [name] has  1+ to trace edema in her  lower extremities bilaterally.  [name] has no  cyanosis or clubbing.  NEUROLOGIC:  [name] has difficulty getting onto the  exam table.  [name] has a shuffling gait.  [name] has no focal neurologic  deficits.   ASSESSMENT:  [name] is an 77 year old female who has a normocytic  anemia.  [name] does have heme-positive stool which the differential  diagnosis includes:  Colorectal polyps, hemorrhoids, or an upper GI  source.  The upper GI source could be gastritis from chronic aspirin  use.  [name] has a low likelihood of occult GI malignancy.  Her  reticulocyte index is 0.64 indicating an inappropriate bone marrow  response to her normocytic anemia.  [name] has adequate iron stores as  indicated by her ferritin of 86.  Her GFR is 40.6, consistent with  chronic renal insufficiency.  Thank you for allowing me to see [name] in consultation.  My recommendations follow.   RECOMMENDATIONS:  1. I recommend [name] be scheduled for colonoscopy followed by      EGD if no source for her heme-positive stools are found.  I would      consider initiation of Procrit in an attempt to correct her anemia      without  requiring periodic blood transfusions.  2. I will hold her Actos, glipizide, and amlodipine on the morning of      her colonoscopy.  3. [name] will be provided at bedside commode at the nursing home on day      of her bowel prep.  Her iron will also be held for a week prior to      her colonoscopy.  4. [name] may follow up with me as needed.      Kassie Mends, M.D.  Electronically Signed     SM/MEDQ  D:  12/14/2006  T:  12/15/2006  Job:  [id number]   cc:   Milus Mallick. Lodema Hong, M.D.  Fax: [phone]

## 2011-05-06 NOTE — H&P (Signed)
[name], [name]                ACCOUNT NO.:  [id number]   MEDICAL RECORD NO.:  [id number]          PATIENT TYPE:  INP   LOCATION:  A203                          FACILITY:  [facility]   PHYSICIAN:  Gardiner Barefoot, MD    DATE OF BIRTH:  1922-06-16   DATE OF ADMISSION:  03/17/2007  DATE OF DISCHARGE:  LH                              HISTORY & PHYSICAL   REASON FOR ADMISSION:  Rectal bleed.   HISTORY OF PRESENT ILLNESS:  Ms. Mccalla is an 77 year old female with a  history of chronic anemia, likely secondary to chronic disease and renal  insufficiency, who presents here with the acute onset of bright red  blood per rectum.  She is interviewed now with no family member present,  severely demented and unable to answer questions.  By report, she began  having BRBPR this am.  No reported dizziness, nausea, vomiting or other  problems.  Of note, the patient has a history of chronic anemia and her  hemoglobin is unchanged compared to October 2007.  She has had a recent  negative colonoscopy and EGD performed by gastroenterology for her  history of anemia.   PAST MEDICAL HISTORY:  1. Alzheimer's.  2. Chronic anemia.  3. Hypertension.  4. Diabetes.  5. Chronic renal insufficiency.  6. Hyperlipidemia.  7. Congestive heart disease.   MEDICATIONS:  1. Temazepam 15 mg q.h.s. at bedtime.  2. Amlodipine 5 mg po daily  3. Tylenol at bedtime.  4. Avalide 300/25 mg p.o. daily.  5. Aricept 10 mg p.o. daily.  6. Glipizide 2.5 mg p.o. daily.  7. Zantac 40 mg p.o. daily.  8. Actos 45 mg p.o. daily.  9. Vytorin 10/10 p.o. daily.  10.Omeprazole 20 mg p.o. daily.  11.Ferrous sulfate 325 mg p.o. daily.  12.Darvocet one tab p.r.n. pain.   ALLERGIES:  BEES.   PAST SURGICAL HISTORY:  Left knee in 1953.   FAMILY HISTORY:  Per the records.  History of bone cancer in father.   SOCIAL HISTORY:  Per the record.  The patient is a widow.   REVIEW OF SYSTEMS:  Unobtainable.   PHYSICAL EXAMINATION:   VITAL SIGNS:  Temperature 98.2 degrees, pulse 60,  respirations 18, blood pressure 129/48.  GENERAL:  The patient is alert, awake but cannot answer all questions.  HEENT:  Anicteric.  LUNGS:  Clear to auscultation bilaterally.  ABDOMEN:  Soft, nontender, non-distended.  Positive bowel sounds.  No  hepatosplenomegaly.  RECTAL:  Old blood, no active bleed, tender.   LABORATORY DATA:  Sodium 142, potassium 4.2, chloride 108, bicarbonate  25, CO2 of 25, BUN 37, creatinine 0.9, glucose 202, calcium 9.2.  Hemoglobin 7.9, hematocrit 25.1, platelets 375.   IMPRESSION/PLAN:  1. Bright red blood per rectum:  The patient has had a recent      extensive workup to include a colonoscopy and an EGD, which showed      no significant pathology.  I believe she has hemorrhoids or anal      fissure at this time.  Gastroenterology has been cosulted for  evaluation and will consider a surgery evaluation if no other      gastrointestinal etiology is determined.  Also will monitor the      patient closely for any acute blood loss, including serial      hemoglobins, IV fluids and will place the patient on telemetry, to      assure close monitoring.  2. Diet:  The patient will be placed n.p.o., including her medicines      at this time, pending further evaluation and will restart her      medicines as appropriate.  3. Patient with significant dementia:  No code status was discussed      with the patient, therefore she will be a full code at this time.      Gardiner Barefoot, MD  Electronically Signed     RWC/MEDQ  D:  03/17/2007  T:  03/17/2007  Job:  [id number]

## 2011-05-06 NOTE — Op Note (Signed)
NAMEBERANIA, PEEDIN                ACCOUNT NO.:  [id number]   MEDICAL RECORD NO.:  [id number]          PATIENT TYPE:  AMB   LOCATION:  DAY                           FACILITY:  [facility]   PHYSICIAN:  Kassie Mends, M.D.      DATE OF BIRTH:  1922/10/31   DATE OF PROCEDURE:  01/11/2007  DATE OF DISCHARGE:                               OPERATIVE REPORT   REFERRING PHYSICIAN:  Milus Mallick. Lodema Hong, M.D.   PROCEDURE:  1. Ileocolonoscopy with cold forceps polypectomy.  2. Esophagogastroduodenoscopy with cold forceps biopsy.   INDICATIONS FOR EXAM:  Ms. Gengler is an 77 year old female with heme-  positive stool and a normocytic anemia.  She is chronically on aspirin.   FINDINGS:  1. A 3 mm sigmoid colon polyp removed via cold forceps, pan colonic      diverticulosis. Otherwise normal distal terminal ileum.  Normal      colon without evidence of masses, inflammatory changes, or AVMs.      No internal hemorrhoids.  2. Multiple polypoid lesions seen in the body of the stomach.      Biopsies obtained via cold forceps.  A 2 cm hiatal hernia.      Otherwise normal esophagus without evidence of Barrett's.  Normal      duodenal bulb and second portion of the duodenum.   RECOMMENDATIONS:  1. High-fiber diet.  Patient given handout on diverticulosis polyps      and high-fiber diet.  I will call her with the results of her      biopsies.  2. No aspirin or anti-inflammatory drugs for seven days.  3. No source for normocytic anemia found.  The likely source of her      anemia is her chronic renal insufficiency in light of her      inadequate bone marrow response with normal IM stores.  Would      consider Procrit.   MEDICATIONS:  1. Demerol 75 mg IV.  2. Versed 6 mg IV.   PROCEDURAL TECHNIQUE:  Physical exam was performed, and informed consent  was obtained per the patient after explaining the risks, benefits and  alternatives to the procedure.  Patient was connected to the monitor and  placed in  the left lateral position.  Continuous oxygen was provided by  nasal cannula, and IV medicine administered through an indwelling  cannula.  After administration of sedation and rectal exam, the  patient's rectum was intubated, and the scope was advanced under direct  visualization to the cecum.  The scope was subsequently removed slowly,  by carefully examined the color, texture, anatomy, and integrity of the  mucosa on the way out.   After the colonoscopy, the patient's esophagus was intubated with a  diagnostic gastroscope.  The scope was advanced under direct  visualization to the second portion of the duodenum.  The scope was  subsequently removed slowly by carefully examining the color, texture,  anatomy, and mucosa on the way out.  The patient was recovered in  endoscopy suite and discharged home in satisfactory condition.      [employer],  M.D.  Electronically Signed     SM/MEDQ  D:  01/11/2007  T:  01/11/2007  Job:  [id number]

## 2011-05-06 NOTE — Consult Note (Signed)
NAMEKELLE, RUPPERT                ACCOUNT NO.:  [id number]   MEDICAL RECORD NO.:  [id number]          PATIENT TYPE:  EMS   LOCATION:  ED                            FACILITY:  [facility]   PHYSICIAN:  J. Darreld Mclean, M.D. DATE OF BIRTH:  Feb 11, 1922   DATE OF CONSULTATION:  07/27/2005  DATE OF DISCHARGE:                                   CONSULTATION   REFERRING PHYSICIAN:  Rhae Lerner. Margretta Ditty, M.D.   The patient is an 77 year old female resident of a rest home in [place].  The  pain and tenderness in her left hip is rather severe.  It bothers her and is  really bad.  She thought she may have a fracture. She was brought to the  emergency room and seen and evaluated by Dr. Margretta Ditty.  He obtained MRI of  the hip that showed greater trochanteric bursitis, gluteus medius muscle  sprain, gluteal tendinopathy but no fractures.  Felt that she has  significant left greater trochanteric bursitis, and I was asked to see her  for an injection.   The patient is very cooperative and oriented.  There is no apparent trauma.  She is very tender over the left trochanteric area, exquisitely so.   I injected the left trochanteric bursa area with Xylocaine 1%, Depo-Medrol  40 with good results. She tolerated it well.   IMPRESSION:  Bursitis, left hip.   She is to put ice on it, weightbearing as tolerated.  I can see her back as  needed in the office.  If any difficulty, she to let me know p.r.n.       JWK/MEDQ  D:  07/27/2005  T:  07/27/2005  Job:  [id number]

## 2011-05-06 NOTE — Consult Note (Signed)
[name], [name]                ACCOUNT NO.:  [id number]   MEDICAL RECORD NO.:  [id number]          PATIENT TYPE:  INP   LOCATION:  A203                          FACILITY:  [facility]   PHYSICIAN:  Kassie Mends, M.D.      DATE OF BIRTH:  [birth year]   DATE OF CONSULTATION:  03/17/2007  DATE OF DISCHARGE:                                 CONSULTATION   REASON FOR CONSULTATION:  Bright red blood per rectum.   REFERRING PHYSICIAN:  Gardiner Barefoot, M.D.   HISTORY OF PRESENT ILLNESS:  [name] is an 77 year old female who  has a history of chronic anemia.  Her hemoglobin was 9.1 in November  2007.  She has anemia of chronic disease secondary to renal  insufficiency.  Her ferritin in November 2007 was measured at 86, and  her baseline creatinine is 1.5.  Her workup for anemia included an  ileocolonoscopy and esophagogastroduodenoscopy in January 2008, which  revealed a 3-mm sigmoid colon polyp, pancolonic diverticulosis, and  benign polyps in the stomach her endoscopy.   The history is limited because [name] is unable to provide a  reliable history due to her underlying dementia and frontal lobe tumor.  Her family is unable to provide any reliable history.  The history is  obtained from a caregiver at the [facility], the physician  in the emergency department, and the electronic medical record. The  caregiver at the rest home states she has one episode of diarrhea the  first of the week.  She has had no vomiting.  She has had no fever.  She  did not eat breakfast on Wednesday.  She did eat lunch.  This morning  she ate breakfast.  Her stools have been hard since Wednesday.  She is  complaining of her stomach hurting today.  She is maintained on aspirin.  She has not had a blood transfusion since January, according to the rest  home and the blood bank.  Yesterday when they wiped they did see blood.  This morning she reports that her bed was full of blood.  This morning  when she urinated there was blood.  She also had an episode of a bloody  bowel movement in the emergency department.  The emergency room  physician reports doing a rectal exam and it was not painful to the  patient.   PAST MEDICAL HISTORY:  1. Diabetes.  2. Frontal lobe tumor.  3. Hyperlipidemia.  4. Dementia.  5. Degenerative joint disease.   PAST SURGICAL HISTORY:  Hysterectomy in 1963.   ALLERGIES:  BEES.   MEDICATIONS:  1. Temazepam.  2. Aspirin 81 mg q.h.s.  3. Tylenol one tablet q.h.s.  4. Vytorin 10/10 tablet q.h.s.  5. Avalide 300/25 mg, one tablet daily.  6. Aricept 10 mg daily.  7. Glipizide XL 2.5 mg tablet daily.  8. Actos 45 mg one tablet daily.  9. Amlodipine 5 mg daily.   FAMILY HISTORY:  She has no family history of colon cancer, colon  polyps.   SOCIAL HISTORY:  She is widowed and has  two nieces that care for her.  She does not use tobacco or alcohol.   REVIEW OF SYSTEMS:  Limited due to the patient's underlying mental  status.   PHYSICAL EXAMINATION:  VITAL SIGNS:  Afebrile and hemodynamically  stable.  GENERAL:  She is in no apparent distress.  She is alert and responds to  questions but does not appear oriented.  HEENT:  Atraumatic, normocephalic.  Pupils are equal and reactive to  light.  Her mouth has no oral lesions and posterior pharynx is without  erythema or exudate.  NECK:  Full range of motion.  No lymphadenopathy.  LUNGS:  Clear to auscultation bilaterally.  CARDIOVASCULAR:  Regular rhythm.  No murmur.  Normal S1 nd S2.  ABDOMEN:  Bowel sounds present.  Soft, nontender, nondistended.  No  rebound or guarding.  No abdominal bruits or pulsatile masses.  RECTAL:  She has hard stool in the vault.  This elicited excruciating  discomfort and pain.  She has external hemorrhoids.  She has a small  amount of bright red blood seen.  NEUROLOGIC:  She follows simple commands.  No new gross focal neurologic  deficits.  EXTREMITIES:  No cyanosis,  clubbing, or edema.   LABORATORY:  White count 7, and hemoglobin 9, platelets 375.  BUN 37,  creatinine 1.49.  Glomerular infiltration rate 41.   ASSESSMENT:  [name] is an 77 year old female with a history of  chronic anemia.  Her hemoglobin was 9.1 in November 2007.  She has had  hard stools for one week per the rest home.  She has also had a  decreased appetite for one week per her family and the rest home.  She  had 3 episodes of bright red blood per rectum and her hemoglobin is 9.  She has received no blood transfusions according to the blood bank.  On  rectal exam she has excruciating pain elicited which is consistent with  an anal fissure.  She has hard stool in her rectal vault but no  impaction.   The differential diagnoses for her bright red blood per rectum include:  Anal fissure and a low likelihood of ischemic colitis, hemorrhoids, or  diverticular bleed.   Thank you for allowing me to see Ms. Marrazzo in consultation.  My  recommendations follow.   RECOMMENDATIONS:  1. Begin Colace 100 mg b.i.d. and MiraLax 17 grams p.o. b.i.d.  2. Low residue diet.  3. Avoid enemas, aspirin, anti-inflammatory drugs, and anticoagulation      .  4. Begin Anusol HC one per rectum b.i.d.  5. She shows no acute indication for endoscopy.  6. Agree with serial CBCs.  7. We will follow.  8. Doubt she will require any additional workup but if she should have      brisk bleeding, then you could consider nuclear medicine scan,      followed by angiography.      Kassie Mends, M.D.  Electronically Signed     SM/MEDQ  D:  03/17/2007  T:  03/17/2007  Job:  [id number]

## 2011-05-06 NOTE — Discharge Summary (Signed)
[name], [name]                ACCOUNT NO.:  [id number]   MEDICAL RECORD NO.:  [id number]          PATIENT TYPE:  INP   LOCATION:  A203                          FACILITY:  [facility]   PHYSICIAN:  Osvaldo Shipper, MD     DATE OF BIRTH:  [birth year]   DATE OF ADMISSION:  03/17/2007  DATE OF DISCHARGE:  03/31/2008LH                               DISCHARGE SUMMARY   PRIMARY CARE PHYSICIAN:  Milus Mallick. Lodema Hong, M.D.   CONSULTATIONS:  Kassie Mends, M.D.   DISCHARGE DIAGNOSES:  1. Hematochezia, likely from anal fissure.  2. Chronic anemia.  3. Type 2 diabetes.  4. Hypertension.  5. Alzheimer's dementia with behavioral issues.  6. History of chronic renal insufficiency, stable.  7. History of congestive heart failure, stable.  8. History of dyslipidemia, stable.   HISTORY AND PHYSICAL:  Please review H&P dictated by Dr. Luciana Axe for  details regarding the patient's presenting illness.   BRIEF HOSPITAL COURSE:  1. Rectal bleeding.  This is an 77 year old African-American female      who lives in [facility], which is a rest home/assisted living      facility who was in her usual state of health when on March 29 she      was found to have bright red blood per rectum.  She was brought      into the emergency department and was subsequently admitted to the      hospital.  Of note, the patient had a recent EGD and a colonoscopy      back in January 2008.  This showed sigmoid polyp and multiple      polypoid lesions in the stomach, otherwise unremarkable.  Rectal      examination revealed tenderness which raised the possibility of      anal fissure.  The patient was seen by gastroenterologist, Dr.      Cira Servant, who felt that her bleeding was probably from the fissure or      possible hemorrhoids or diverticular bleed.  The patient's      hemoglobins were monitored closely.  When she came in her      hemoglobin was 9.0, which stayed stable in that range.  Hemoglobin      today is 9.9.  She did  not require any blood transfusions.  Her      baseline hemoglobin runs between 9 and 10.  Her other lab      parameters were also unremarkable.  She was put on a PPI and she      was put on a bowel regimen to avoid constipation.  The patient has      been having soft stools, which have been dark brown in color with      no evidence for bright red blood.  Since her hemoglobin is stable,      she is considered stable to go back to the rest home.  2. The patient has a history of Alzheimer's dementia and she has      sundowning and behavioral problems.  At times she is found to be  agitated.  I think when she returns to her rest home and is in      familiar surroundings this should also improve.  3. Her other issues including diabetes, hypertension, etc. remained      stable during this admission.   On the day of discharge, the patient was found to be comfortably sitting  on the chair.  She was not very communicative, though she denied any  pain.  She did not allow me to examine her fully.  Her vital signs were  found to be stable.  She did have a slightly elevated blood pressure but  her other parameters were within normal range.  Labs were also normal,  as discussed above.  And as discussed above, she is considered stable to  go back to the rest home today.   DISCHARGE MEDICATIONS:  1. Dulcolax 10 mg by mouth every 8 hours, please hold if diarrhea.  2. Colace 100 mg by mouth twice daily.  3. Anusol HC to be applied to the anal area twice a day.  4. MiraLax 17 grams by mouth twice daily, hold for diarrhea.  5. Ferrous sulfate 325 mg once a day.  6. TriLyte flavors packet once a day.  7. Vytorin 10/10 at bedtime.  8. Temazepam 15 mg at bedtime.  9. Tylenol 650 mg by mouth daily in the evening.  10.Avalide 300/25 one tablet once a day.  11.Aricept 10 mg once a day.  12.Glipizide XL 2.5 mg once a day.  13.Actos 45 mg once a day.  14.Amlodipine 5 mg once a day.  15.Omeprazole 20 mg  once a day.   The patient is not to have any more aspirin from now on.   Please check her capillary blood sugars as you had been doing before and  call Dr. Lodema Hong for further instructions regarding blood sugar control.   FOLLOWUP:  With Dr. Lodema Hong in 3-4 weeks, with Dr. Cira Servant as it has been  arranged before.   DIET:  She may have low residue diet.   PHYSICAL ACTIVITY:  As before.   PROCEDURES:  She did not have any kind of procedures during this  admission.   PATHOLOGY:  Of note, surgical pathology from the polyps in the sigmoid  as well as in the stomach did not reveal any evidence for malignancy and  this was done in January of 2008.   TOTAL TIME OF DISCHARGE:  Thirty-five minutes.      Osvaldo Shipper, MD  Electronically Signed     GK/MEDQ  D:  03/19/2007  T:  03/19/2007  Job:  [id number]   cc:   Milus Mallick. Lodema Hong, M.D.  Fax: [phone]   Kassie Mends, M.D.  [address]  [place] , [place] [address]

## 2011-09-29 LAB — CBC
HCT: 24 — ABNORMAL LOW
HCT: 25.5 — ABNORMAL LOW
HCT: 28 — ABNORMAL LOW
Hemoglobin: 8.4 — ABNORMAL LOW
MCHC: 33.4
MCHC: 33.4
MCV: 91.6
MCV: 91.6
Platelets: 303
Platelets: 307
Platelets: 411 — ABNORMAL HIGH
RBC: 2.62 — ABNORMAL LOW
RDW: 12.9
WBC: 12.7 — ABNORMAL HIGH
WBC: 12.7 — ABNORMAL HIGH

## 2011-09-29 LAB — DIFFERENTIAL
Basophils Absolute: 0
Basophils Relative: 0
Eosinophils Absolute: 0
Eosinophils Relative: 0
Lymphocytes Relative: 5 — ABNORMAL LOW
Lymphocytes Relative: 9 — ABNORMAL LOW
Lymphs Abs: 0.7
Lymphs Abs: 1.1
Lymphs Abs: 1.4
Monocytes Absolute: 0.5
Monocytes Relative: 1 — ABNORMAL LOW
Monocytes Relative: 6
Neutro Abs: 11.1 — ABNORMAL HIGH
Neutro Abs: 11.7 — ABNORMAL HIGH
Neutro Abs: 9.2 — ABNORMAL HIGH
Neutrophils Relative %: 79 — ABNORMAL HIGH
Neutrophils Relative %: 84 — ABNORMAL HIGH
Neutrophils Relative %: 93 — ABNORMAL HIGH

## 2011-09-29 LAB — CULTURE, BLOOD (ROUTINE X 2): Culture: NO GROWTH

## 2011-09-29 LAB — BASIC METABOLIC PANEL
BUN: 17
BUN: 21
CO2: 26
CO2: 27
Calcium: 8.4
Calcium: 8.7
Chloride: 106
Chloride: 111
Creatinine, Ser: 1.05
Creatinine, Ser: 1.09
Creatinine, Ser: 1.35 — ABNORMAL HIGH
GFR calc Af Amer: 58 — ABNORMAL LOW
GFR calc Af Amer: >60
GFR calc non Af Amer: 48 — ABNORMAL LOW
Glucose, Bld: 128 — ABNORMAL HIGH
Glucose, Bld: 181 — ABNORMAL HIGH
Potassium: 3.7
Potassium: 3.9
Sodium: 145
Sodium: 151 — ABNORMAL HIGH

## 2011-09-29 LAB — PROTIME-INR: INR: 1

## 2011-09-30 LAB — COMPREHENSIVE METABOLIC PANEL
Albumin: 3.4 — ABNORMAL LOW
Alkaline Phosphatase: 52
BUN: 22
CO2: 28
Chloride: 105
Creatinine, Ser: 1.36 — ABNORMAL HIGH
GFR calc non Af Amer: 37 — ABNORMAL LOW
Glucose, Bld: 204 — ABNORMAL HIGH
Potassium: 3.8
Total Bilirubin: 0.5

## 2011-09-30 LAB — CBC
HCT: 26.8 — ABNORMAL LOW
Hemoglobin: 8.9 — ABNORMAL LOW
MCV: 91.6
Platelets: 340
WBC: 7.8

## 2011-09-30 LAB — DIFFERENTIAL
Basophils Absolute: 0
Basophils Relative: 0
Lymphocytes Relative: 30
Neutro Abs: 4.8
Neutrophils Relative %: 62

## 2011-09-30 LAB — URINALYSIS, ROUTINE W REFLEX MICROSCOPIC
Bilirubin Urine: NEGATIVE
Leukocytes, UA: NEGATIVE
Nitrite: POSITIVE — AB
Specific Gravity, Urine: 1.015
Urobilinogen, UA: 0.2
pH: 7.5

## 2011-09-30 LAB — URINE MICROSCOPIC-ADD ON

## 2016-11-22 ENCOUNTER — Telehealth: Payer: Self-pay | Admitting: Internal Medicine

## 2016-11-22 ENCOUNTER — Telehealth: Payer: Self-pay | Admitting: Gastroenterology

## 2016-11-22 NOTE — Telephone Encounter (Signed)
Letter mailed to pt.  

## 2016-11-22 NOTE — Telephone Encounter (Signed)
Recall for tcs °
